# Patient Record
Sex: Male | Born: 1965 | Race: White | Hispanic: No | Marital: Married | Smoking: Never smoker
Health system: Southern US, Community
[De-identification: ages and names within clinical notes are randomized; demographics above are authoritative.]

## PROBLEM LIST (undated history)

## (undated) DIAGNOSIS — F419 Anxiety disorder, unspecified: Secondary | ICD-10-CM

## (undated) DIAGNOSIS — M1990 Unspecified osteoarthritis, unspecified site: Secondary | ICD-10-CM

## (undated) DIAGNOSIS — E079 Disorder of thyroid, unspecified: Secondary | ICD-10-CM

## (undated) DIAGNOSIS — E785 Hyperlipidemia, unspecified: Secondary | ICD-10-CM

## (undated) HISTORY — DX: Hyperlipidemia, unspecified: E78.5

## (undated) HISTORY — PX: TONSILLECTOMY: SHX5217

## (undated) HISTORY — DX: Anxiety disorder, unspecified: F41.9

## (undated) HISTORY — DX: Disorder of thyroid, unspecified: E07.9

## (undated) HISTORY — PX: WISDOM TOOTH EXTRACTION: SHX21

## (undated) HISTORY — PX: HERNIA REPAIR: SHX51

## (undated) HISTORY — DX: Unspecified osteoarthritis, unspecified site: M19.90

---

## 1994-11-03 HISTORY — PX: KNEE SURGERY: SHX244

## 2002-06-26 ENCOUNTER — Emergency Department (HOSPITAL_COMMUNITY): Admission: EM | Admit: 2002-06-26 | Discharge: 2002-06-26 | Payer: Self-pay | Admitting: *Deleted

## 2007-06-16 ENCOUNTER — Ambulatory Visit: Payer: Self-pay | Admitting: Licensed Clinical Social Worker

## 2007-06-23 ENCOUNTER — Ambulatory Visit: Payer: Self-pay | Admitting: Licensed Clinical Social Worker

## 2007-07-27 ENCOUNTER — Ambulatory Visit: Payer: Self-pay | Admitting: Licensed Clinical Social Worker

## 2015-06-28 ENCOUNTER — Other Ambulatory Visit: Payer: Self-pay | Admitting: Neurosurgery

## 2015-06-28 DIAGNOSIS — N281 Cyst of kidney, acquired: Secondary | ICD-10-CM

## 2015-07-03 ENCOUNTER — Other Ambulatory Visit: Payer: Self-pay | Admitting: Neurosurgery

## 2015-07-03 ENCOUNTER — Ambulatory Visit
Admission: RE | Admit: 2015-07-03 | Discharge: 2015-07-03 | Disposition: A | Discharge status: Home / Self Care | Payer: PRIVATE HEALTH INSURANCE | Source: Ambulatory Visit | Attending: Neurosurgery | Admitting: Neurosurgery

## 2015-07-03 DIAGNOSIS — N281 Cyst of kidney, acquired: Secondary | ICD-10-CM

## 2015-08-16 ENCOUNTER — Other Ambulatory Visit (HOSPITAL_COMMUNITY): Payer: Self-pay | Admitting: Urology

## 2015-08-16 DIAGNOSIS — D49519 Neoplasm of unspecified behavior of unspecified kidney: Secondary | ICD-10-CM

## 2015-08-27 ENCOUNTER — Ambulatory Visit (HOSPITAL_COMMUNITY)
Admission: RE | Admit: 2015-08-27 | Discharge: 2015-08-27 | Disposition: A | Discharge status: Home / Self Care | Payer: 59 | Source: Ambulatory Visit | Attending: Urology | Admitting: Urology

## 2015-08-27 DIAGNOSIS — D7389 Other diseases of spleen: Secondary | ICD-10-CM | POA: Insufficient documentation

## 2015-08-27 DIAGNOSIS — D49519 Neoplasm of unspecified behavior of unspecified kidney: Secondary | ICD-10-CM | POA: Diagnosis not present

## 2015-08-27 MED ORDER — GADOBENATE DIMEGLUMINE 529 MG/ML IV SOLN: 20.0000 mL | Freq: Once | INTRAVENOUS | Status: DC | PRN

## 2015-08-27 MED ORDER — GADOBENATE DIMEGLUMINE 529 MG/ML IV SOLN
20.0000 mL | Freq: Once | INTRAVENOUS | Status: AC | PRN
  Administered 2015-08-27: 17 mL via INTRAVENOUS

## 2016-01-23 ENCOUNTER — Other Ambulatory Visit (HOSPITAL_COMMUNITY): Payer: Self-pay | Admitting: Urology

## 2016-01-23 DIAGNOSIS — D4101 Neoplasm of uncertain behavior of right kidney: Secondary | ICD-10-CM

## 2016-03-10 ENCOUNTER — Ambulatory Visit (HOSPITAL_COMMUNITY)
Admission: RE | Admit: 2016-03-10 | Discharge: 2016-03-10 | Disposition: A | Discharge status: Home / Self Care | Payer: BLUE CROSS/BLUE SHIELD | Source: Ambulatory Visit | Attending: Urology | Admitting: Urology

## 2016-03-10 DIAGNOSIS — D734 Cyst of spleen: Secondary | ICD-10-CM | POA: Insufficient documentation

## 2016-03-10 DIAGNOSIS — D4101 Neoplasm of uncertain behavior of right kidney: Secondary | ICD-10-CM | POA: Diagnosis present

## 2016-03-10 MED ORDER — GADOBENATE DIMEGLUMINE 529 MG/ML IV SOLN
20.0000 mL | Freq: Once | INTRAVENOUS | Status: AC | PRN
  Administered 2016-03-10: 17 mL via INTRAVENOUS

## 2016-04-23 ENCOUNTER — Encounter: Payer: Self-pay | Admitting: Internal Medicine

## 2016-07-09 ENCOUNTER — Telehealth: Payer: Self-pay | Admitting: *Deleted

## 2016-07-09 NOTE — Telephone Encounter (Signed)
OK to proceed with colonoscopy.

## 2016-07-09 NOTE — Telephone Encounter (Signed)
Dr. Carlean Purl,  This pt is coming in on 07-18-16 to [facility] for a screening colonoscopy on 08-01-16.  While getting his chart ready, I noted he last saw his PCP on 04-23-16.  He was supposed to get a lap hernia repair with Dr. Rosendo Gros the following week. This information isn't in EPIC; it came via referral information from [facility].  He will be about 3 months post op.  Is he ok to proceed as scheduled?  Thanks, [employer]

## 2016-07-09 NOTE — Telephone Encounter (Signed)
noted 

## 2016-07-18 ENCOUNTER — Ambulatory Visit: Payer: BLUE CROSS/BLUE SHIELD | Admitting: *Deleted

## 2016-07-18 VITALS — Ht 72.0 in | Wt 197.4 lb

## 2016-07-18 DIAGNOSIS — Z1211 Encounter for screening for malignant neoplasm of colon: Secondary | ICD-10-CM

## 2016-07-18 NOTE — Progress Notes (Signed)
Patient denies any allergies to egg or soy products. Patient denies complications with anesthesia/sedation.  Patient denies oxygen use at home and denies diet medications. Emmi instructions for colonoscopy explained but patient refused.  Pamphlet given.Marland Kitchen

## 2016-07-24 ENCOUNTER — Encounter: Payer: Self-pay | Admitting: Internal Medicine

## 2016-07-29 ENCOUNTER — Telehealth: Payer: Self-pay | Admitting: Internal Medicine

## 2016-07-29 NOTE — Telephone Encounter (Signed)
[facility] 348 pm at number that identifies pt by first and last name , Wesley Key

## 2016-07-29 NOTE — Telephone Encounter (Signed)
Scammon on pt's VM at [phone]. Explained we tried him 3 times today with no success and for him to try [place] tomorrow 07-30-16 Wednesday   [facility]

## 2016-07-29 NOTE — Telephone Encounter (Signed)
Called pt and left message we were returning his call regarding his prep questions.

## 2016-08-01 ENCOUNTER — Ambulatory Visit (AMBULATORY_SURGERY_CENTER): Payer: BLUE CROSS/BLUE SHIELD | Admitting: Internal Medicine

## 2016-08-01 ENCOUNTER — Encounter: Payer: Self-pay | Admitting: Internal Medicine

## 2016-08-01 VITALS — BP 124/82 | HR 67 | Temp 96.8°F | Resp 14 | Ht 72.0 in | Wt 197.0 lb

## 2016-08-01 DIAGNOSIS — Z1211 Encounter for screening for malignant neoplasm of colon: Secondary | ICD-10-CM | POA: Diagnosis present

## 2016-08-01 MED ORDER — SODIUM CHLORIDE 0.9 % IV SOLN: 500.0000 mL | INTRAVENOUS | Status: AC

## 2016-08-01 NOTE — Patient Instructions (Addendum)
Your colonoscopy did not show any polyps or cancer. Next routine colonoscopy/screening test in 10 years - 2027  I appreciate the opportunity to care for you. Gatha Mayer, MD, FACG  YOU HAD AN ENDOSCOPIC PROCEDURE TODAY AT [facility]:   Refer to the procedure report that was given to you for any specific questions about what was found during the examination.  If the procedure report does not answer your questions, please call your gastroenterologist to clarify.  If you requested that your care partner not be given the details of your procedure findings, then the procedure report has been included in a sealed envelope for you to review at your convenience later.  YOU SHOULD EXPECT: Some feelings of bloating in the abdomen. Passage of more gas than usual.  Walking can help get rid of the air that was put into your GI tract during the procedure and reduce the bloating. If you had a lower endoscopy (such as a colonoscopy or flexible sigmoidoscopy) you may notice spotting of blood in your stool or on the toilet paper. If you underwent a bowel prep for your procedure, you may not have a normal bowel movement for a few days.  Please Note:  You might notice some irritation and congestion in your nose or some drainage.  This is from the oxygen used during your procedure.  There is no need for concern and it should clear up in a day or so.  SYMPTOMS TO REPORT IMMEDIATELY:   Following lower endoscopy (colonoscopy or flexible sigmoidoscopy):  Excessive amounts of blood in the stool  Significant tenderness or worsening of abdominal pains  Swelling of the abdomen that is new, acute  Fever of 100F or higher   For urgent or emergent issues, a gastroenterologist can be reached at any hour by calling [phone].   DIET:  We do recommend a small meal at first, but then you may proceed to your regular diet.  Drink plenty of fluids but you should avoid alcoholic beverages for  24 hours.  ACTIVITY:  You should plan to take it easy for the rest of today and you should NOT DRIVE or use heavy machinery until tomorrow (because of the sedation medicines used during the test).    FOLLOW UP: Our staff will call the number listed on your records the next business day following your procedure to check on you and address any questions or concerns that you may have regarding the information given to you following your procedure. If we do not reach you, we will leave a message.  However, if you are feeling well and you are not experiencing any problems, there is no need to return our call.  We will assume that you have returned to your regular daily activities without incident.  If any biopsies were taken you will be contacted by phone or by letter within the next 1-3 weeks.  Please call us at [phone] if you have not heard about the biopsies in 3 weeks.    SIGNATURES/CONFIDENTIALITY: You and/or your care partner have signed paperwork which will be entered into your electronic medical record.  These signatures attest to the fact that that the information above on your After Visit Summary has been reviewed and is understood.  Full responsibility of the confidentiality of this discharge information lies with you and/or your care-partner.  Read all of the handouts given to you by your recovery room nurse.   Thank-you for choosing [place] for your  healthcare needs today.

## 2016-08-01 NOTE — Op Note (Signed)
[facility] Patient Name: Wesley Key Procedure Date: 08/01/2016 9:09 AM MRN: [id number] Endoscopist: Gatha Mayer , MD Age: 50 Referring MD:  Date of Birth: Mar 29, 1966 Gender: Male Account #: [id number] Procedure:                Colonoscopy Indications:              Screening for colorectal malignant neoplasm Medicines:                Propofol per Anesthesia, Monitored Anesthesia Care Procedure:                Pre-Anesthesia Assessment:                           - Prior to the procedure, a History and Physical                            was performed, and patient medications and                            allergies were reviewed. The patient's tolerance of                            previous anesthesia was also reviewed. The risks                            and benefits of the procedure and the sedation                            options and risks were discussed with the patient.                            All questions were answered, and informed consent                            was obtained. Prior Anticoagulants: The patient has                            taken no previous anticoagulant or antiplatelet                            agents. ASA Grade Assessment: II - A patient with                            mild systemic disease. After reviewing the risks                            and benefits, the patient was deemed in                            satisfactory condition to undergo the procedure.                           After obtaining informed consent, the colonoscope  was passed under direct vision. Throughout the                            procedure, the patient's blood pressure, pulse, and                            oxygen saturations were monitored continuously. The                            Model PCF-H190L [id number]) scope was introduced                            through the anus and advanced to the the cecum,      identified by appendiceal orifice and ileocecal                            valve. The quality of the bowel preparation was                            good. The colonoscopy was performed without                            difficulty. The patient tolerated the procedure                            well. The bowel preparation used was Miralax. The                            ileocecal valve, appendiceal orifice, and rectum                            were photographed. Scope In: 9:19:55 AM Scope Out: 9:36:50 AM Scope Withdrawal Time: 0 hours 14 minutes 38 seconds  Total Procedure Duration: 0 hours 16 minutes 55 seconds  Findings:                 The perianal and digital rectal examinations were                            normal. Pertinent negatives include normal prostate                            (size, shape, and consistency).                           A few diverticula were found in the sigmoid colon.                           The exam was otherwise without abnormality on                            direct and retroflexion views. Complications:            No immediate complications. Estimated blood loss:  None. Estimated Blood Loss:     Estimated blood loss: none. Impression:               - Mild diverticulosis in the sigmoid colon.                           - The examination was otherwise normal on direct                            and retroflexion views.                           - No specimens collected. Recommendation:           - Repeat colonoscopy in 10 years for screening                            purposes.                           - Patient has a contact number available for                            emergencies. The signs and symptoms of potential                            delayed complications were discussed with the                            patient. Return to normal activities tomorrow.                            Written discharge instructions  were provided to the                            patient.                           - Resume previous diet.                           - Continue present medications.                           - Patient has a contact number available for                            emergencies. The signs and symptoms of potential                            delayed complications were discussed with the                            patient. Return to normal activities tomorrow.                            Written discharge instructions were provided to the  patient. Gatha Mayer, MD 08/01/2016 9:44:53 AM This report has been signed electronically.

## 2016-08-01 NOTE — Progress Notes (Signed)
A/ox3 pleased with MAC, report to Suzanne RN 

## 2016-08-04 ENCOUNTER — Telehealth: Payer: Self-pay

## 2016-08-04 NOTE — Telephone Encounter (Signed)
  Follow up Call-  Call back number 08/01/2016  Post procedure Call Back phone  # [phone]  Permission to leave phone message Yes  Some recent data might be hidden     Patient questions:  Do you have a fever, pain , or abdominal swelling? No. Pain Score  0 *  Have you tolerated food without any problems? Yes.    Have you been able to return to your normal activities? Yes.    Do you have any questions about your discharge instructions: Diet   No. Medications  No. Follow up visit  No.  Do you have questions or concerns about your Care? No.  Actions: * If pain score is 4 or above: No action needed, pain <4.  No problems per the pt. maw

## 2018-11-05 DIAGNOSIS — L570 Actinic keratosis: Secondary | ICD-10-CM | POA: Diagnosis not present

## 2018-11-05 DIAGNOSIS — D225 Melanocytic nevi of trunk: Secondary | ICD-10-CM | POA: Diagnosis not present

## 2018-11-05 DIAGNOSIS — D2362 Other benign neoplasm of skin of left upper limb, including shoulder: Secondary | ICD-10-CM | POA: Diagnosis not present

## 2019-02-07 DIAGNOSIS — Z0000 Encounter for general adult medical examination without abnormal findings: Secondary | ICD-10-CM | POA: Diagnosis not present

## 2019-02-15 DIAGNOSIS — R82998 Other abnormal findings in urine: Secondary | ICD-10-CM | POA: Diagnosis not present

## 2019-02-18 DIAGNOSIS — Z0000 Encounter for general adult medical examination without abnormal findings: Secondary | ICD-10-CM | POA: Diagnosis not present

## 2019-05-12 ENCOUNTER — Other Ambulatory Visit: Payer: Self-pay | Admitting: Internal Medicine

## 2019-05-12 DIAGNOSIS — E785 Hyperlipidemia, unspecified: Secondary | ICD-10-CM

## 2019-06-22 ENCOUNTER — Other Ambulatory Visit: Payer: BLUE CROSS/BLUE SHIELD

## 2020-03-19 ENCOUNTER — Ambulatory Visit
Admission: RE | Admit: 2020-03-19 | Discharge: 2020-03-19 | Disposition: A | Discharge status: Home / Self Care | Payer: Self-pay | Source: Ambulatory Visit | Attending: Internal Medicine | Admitting: Internal Medicine

## 2020-03-19 DIAGNOSIS — E785 Hyperlipidemia, unspecified: Secondary | ICD-10-CM

## 2021-01-02 DIAGNOSIS — L989 Disorder of the skin and subcutaneous tissue, unspecified: Secondary | ICD-10-CM | POA: Diagnosis not present

## 2021-01-02 DIAGNOSIS — L821 Other seborrheic keratosis: Secondary | ICD-10-CM | POA: Diagnosis not present

## 2021-01-02 DIAGNOSIS — L723 Sebaceous cyst: Secondary | ICD-10-CM | POA: Diagnosis not present

## 2021-01-02 DIAGNOSIS — D225 Melanocytic nevi of trunk: Secondary | ICD-10-CM | POA: Diagnosis not present

## 2021-01-02 DIAGNOSIS — L578 Other skin changes due to chronic exposure to nonionizing radiation: Secondary | ICD-10-CM | POA: Diagnosis not present

## 2021-01-02 DIAGNOSIS — D485 Neoplasm of uncertain behavior of skin: Secondary | ICD-10-CM | POA: Diagnosis not present

## 2021-02-19 DIAGNOSIS — Z125 Encounter for screening for malignant neoplasm of prostate: Secondary | ICD-10-CM | POA: Diagnosis not present

## 2021-02-19 DIAGNOSIS — E039 Hypothyroidism, unspecified: Secondary | ICD-10-CM | POA: Diagnosis not present

## 2021-02-19 DIAGNOSIS — E785 Hyperlipidemia, unspecified: Secondary | ICD-10-CM | POA: Diagnosis not present

## 2021-02-26 DIAGNOSIS — Z0000 Encounter for general adult medical examination without abnormal findings: Secondary | ICD-10-CM | POA: Diagnosis not present

## 2021-02-26 DIAGNOSIS — Z1331 Encounter for screening for depression: Secondary | ICD-10-CM | POA: Diagnosis not present

## 2021-02-26 DIAGNOSIS — R82998 Other abnormal findings in urine: Secondary | ICD-10-CM | POA: Diagnosis not present

## 2021-02-26 DIAGNOSIS — Z23 Encounter for immunization: Secondary | ICD-10-CM | POA: Diagnosis not present

## 2021-02-26 DIAGNOSIS — I2584 Coronary atherosclerosis due to calcified coronary lesion: Secondary | ICD-10-CM | POA: Diagnosis not present

## 2021-03-18 DIAGNOSIS — D485 Neoplasm of uncertain behavior of skin: Secondary | ICD-10-CM | POA: Diagnosis not present

## 2021-03-18 DIAGNOSIS — D487 Neoplasm of uncertain behavior of other specified sites: Secondary | ICD-10-CM | POA: Diagnosis not present

## 2021-08-29 DIAGNOSIS — H5213 Myopia, bilateral: Secondary | ICD-10-CM | POA: Diagnosis not present

## 2021-08-29 DIAGNOSIS — D3131 Benign neoplasm of right choroid: Secondary | ICD-10-CM | POA: Diagnosis not present

## 2022-01-15 DIAGNOSIS — D225 Melanocytic nevi of trunk: Secondary | ICD-10-CM | POA: Diagnosis not present

## 2022-01-15 DIAGNOSIS — D2362 Other benign neoplasm of skin of left upper limb, including shoulder: Secondary | ICD-10-CM | POA: Diagnosis not present

## 2022-01-15 DIAGNOSIS — L570 Actinic keratosis: Secondary | ICD-10-CM | POA: Diagnosis not present

## 2022-01-15 DIAGNOSIS — L578 Other skin changes due to chronic exposure to nonionizing radiation: Secondary | ICD-10-CM | POA: Diagnosis not present

## 2022-01-15 DIAGNOSIS — L821 Other seborrheic keratosis: Secondary | ICD-10-CM | POA: Diagnosis not present

## 2022-03-03 DIAGNOSIS — Z125 Encounter for screening for malignant neoplasm of prostate: Secondary | ICD-10-CM | POA: Diagnosis not present

## 2022-03-03 DIAGNOSIS — Z0000 Encounter for general adult medical examination without abnormal findings: Secondary | ICD-10-CM | POA: Diagnosis not present

## 2022-03-03 DIAGNOSIS — E039 Hypothyroidism, unspecified: Secondary | ICD-10-CM | POA: Diagnosis not present

## 2022-03-03 DIAGNOSIS — E785 Hyperlipidemia, unspecified: Secondary | ICD-10-CM | POA: Diagnosis not present

## 2022-03-10 DIAGNOSIS — Z0000 Encounter for general adult medical examination without abnormal findings: Secondary | ICD-10-CM | POA: Diagnosis not present

## 2022-03-10 DIAGNOSIS — R82998 Other abnormal findings in urine: Secondary | ICD-10-CM | POA: Diagnosis not present

## 2022-03-10 DIAGNOSIS — E039 Hypothyroidism, unspecified: Secondary | ICD-10-CM | POA: Diagnosis not present

## 2022-03-10 DIAGNOSIS — Z1331 Encounter for screening for depression: Secondary | ICD-10-CM | POA: Diagnosis not present

## 2022-04-25 IMAGING — CT CT CARDIAC CORONARY ARTERY CALCIUM SCORE
3 series · 14 of 20 positions shown, 16 images · non-contrast
Comparison: None

CLINICAL DATA: History of hyperlipidemia in a 53-year-old white
male.

EXAM:
CT CARDIAC CORONARY ARTERY CALCIUM SCORE
TECHNIQUE: Non-contrast imaging through the heart was performed using
prospective ECG gating. Image post processing was performed on an
independent workstation, allowing for quantitative analysis of the
heart and coronary arteries. Note that this exam targets the heart
and the chest was not imaged in its entirety.

[Series 2: calcium scoring 2.00 qr36 bestdiast 70% hrt calciu · axial · 0.41mm/px · z∈[+1847,+1943]mm · 4 of 80 slices shown]
[im 16/80  vessel]
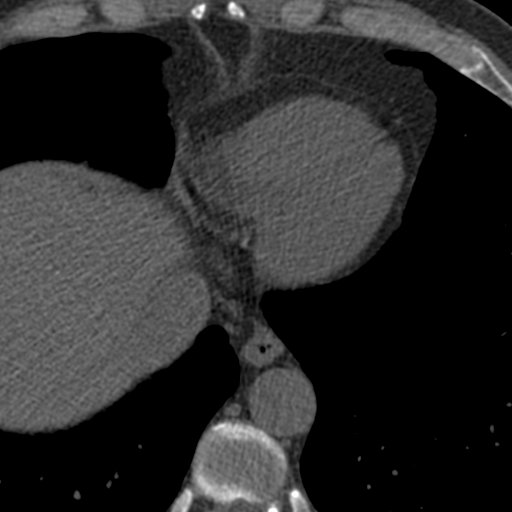
[im 32/80  vessel]
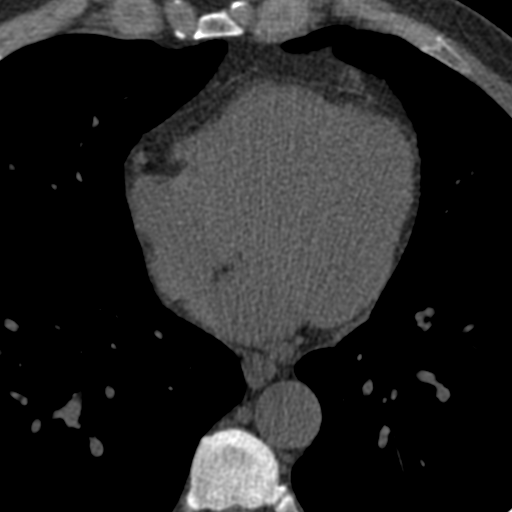
[im 48/80  vessel]
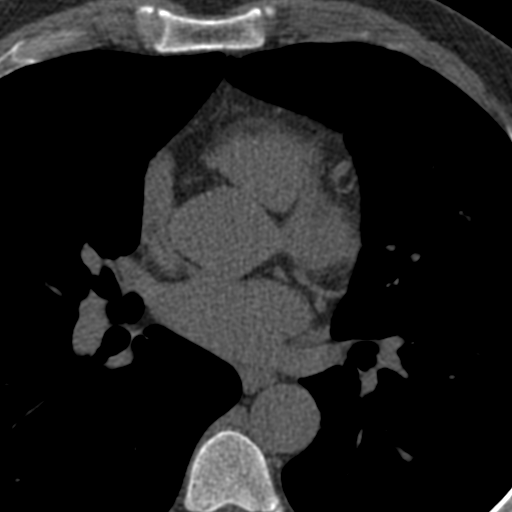
[im 64/80  vessel]
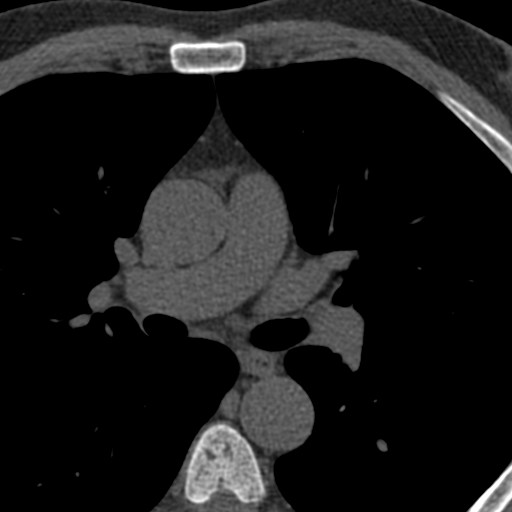

[Series 3: calcium scoring 2.00 br40 bestdiast 70% axial · axial · 0.61mm/px · z∈[+1843,+1947]mm · 5 of 80 slices shown, 7 images]
[im 14/80  vessel]
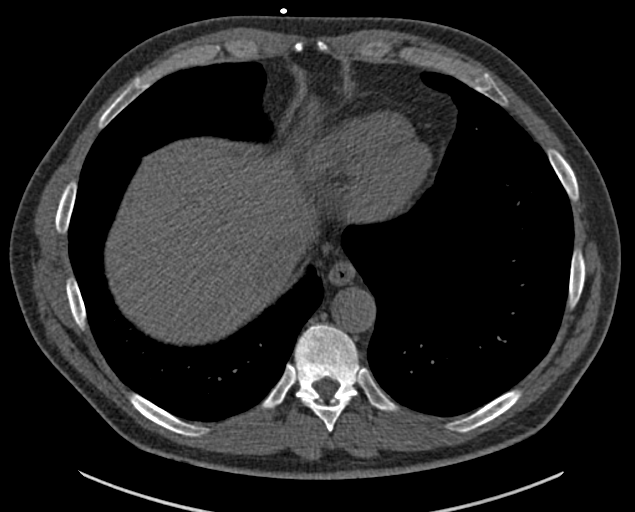
[im 14/80  lung]
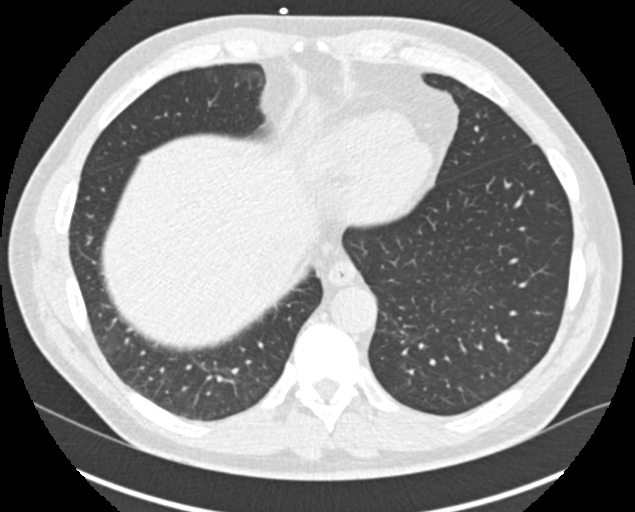
[im 27/80  vessel]
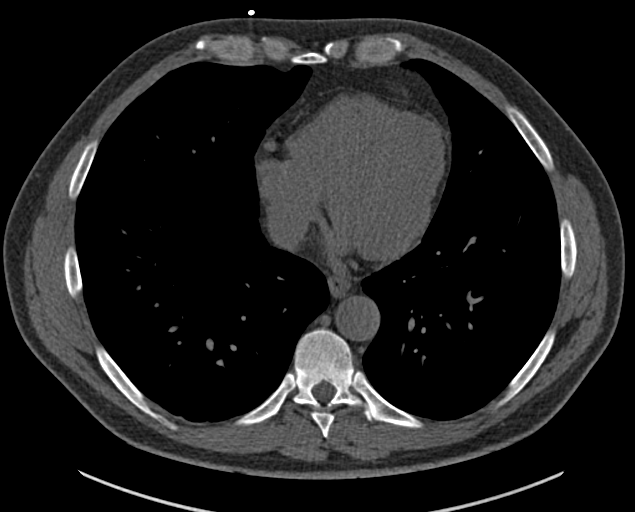
[im 40/80  vessel]
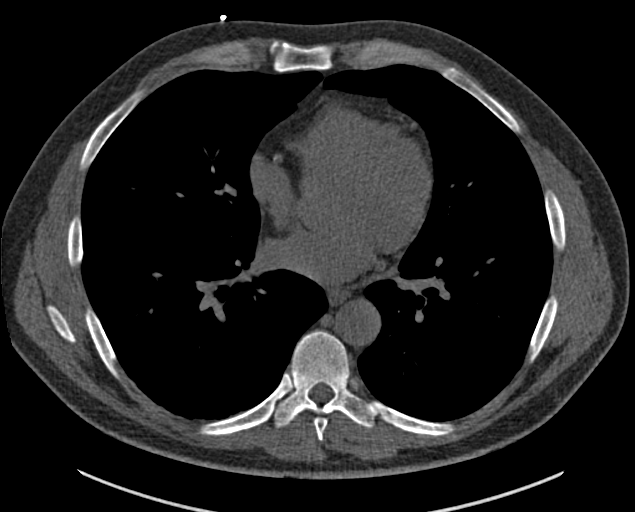
[im 53/80  vessel]
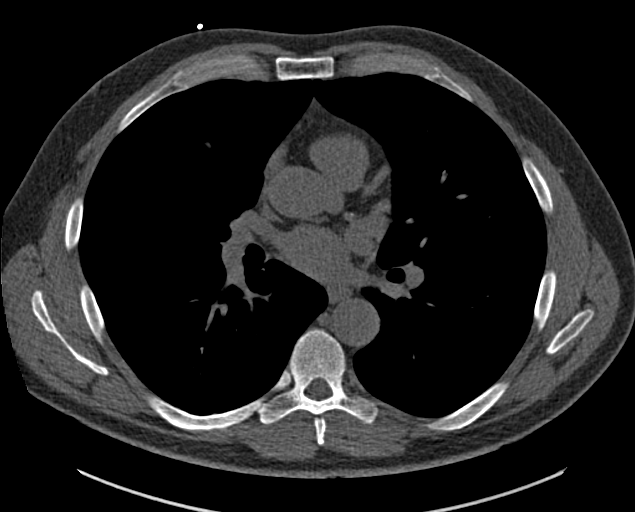
[im 66/80  vessel]
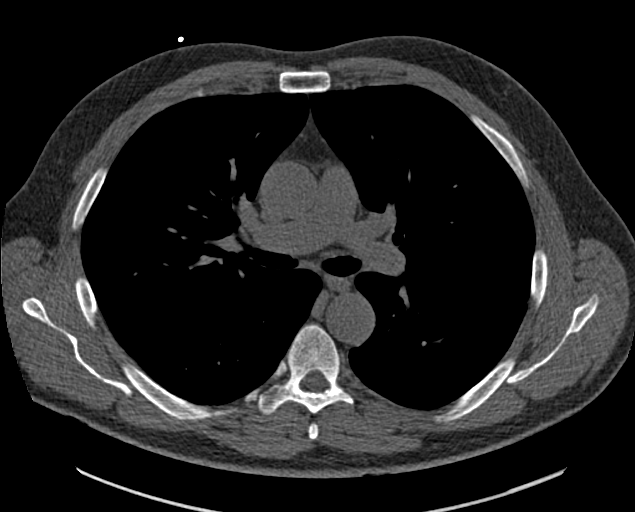
[im 66/80  lung]
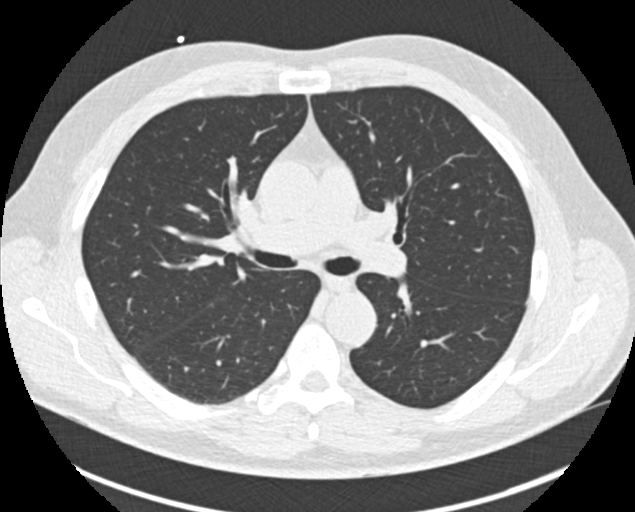

[Series 9: calcium scoring 2.00 br60 bestdiast 70% lungs · axial · 0.61mm/px · z∈[+1843,+1947]mm · 5 of 80 slices shown]
[im 14/80  vessel]
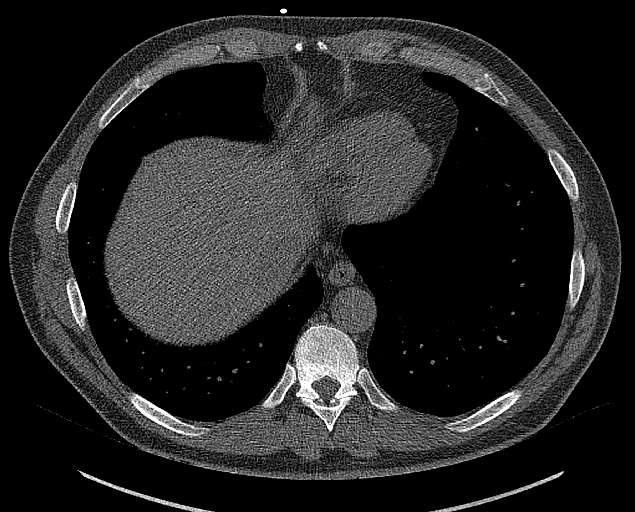
[im 27/80  vessel]
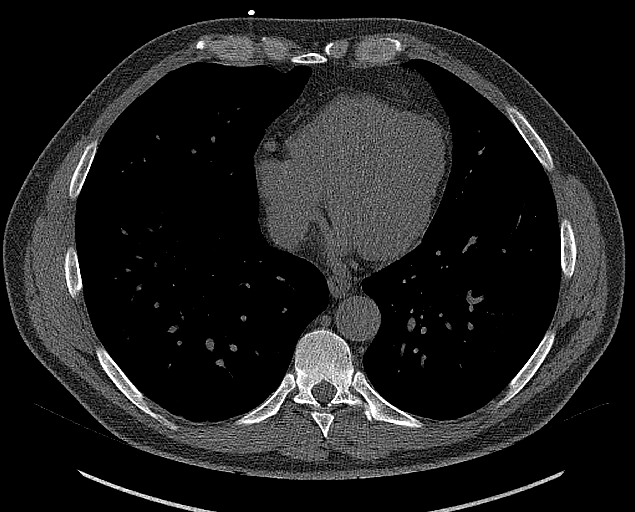
[im 40/80  vessel]
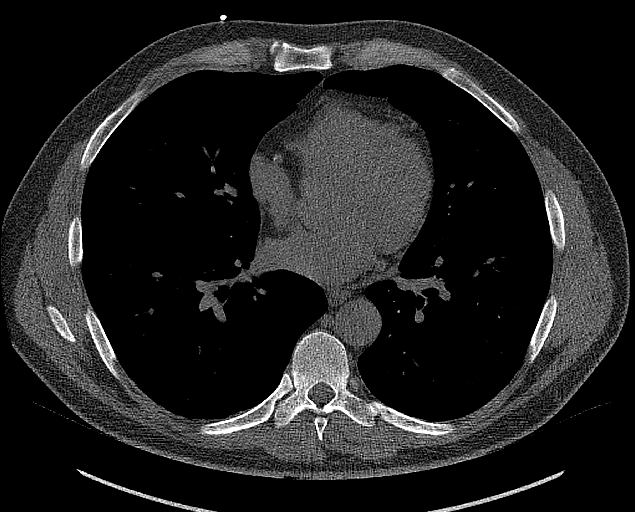
[im 53/80  vessel]
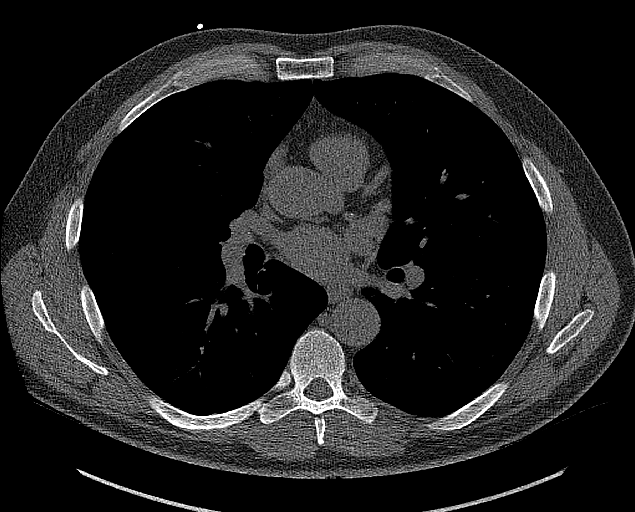
[im 66/80  vessel]
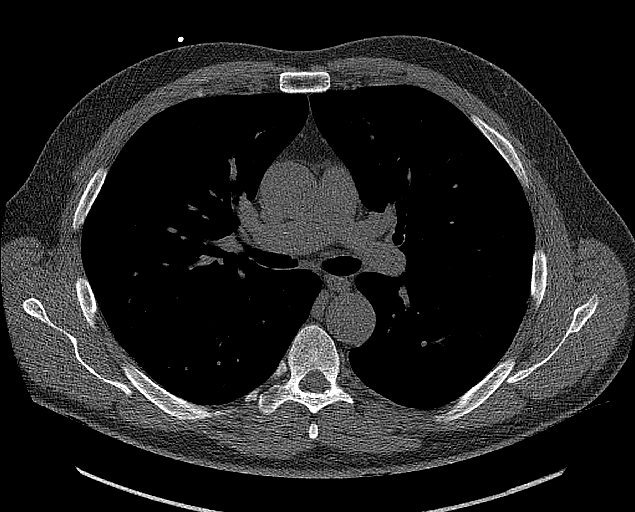

[14 of 20 positions shown; findings below may reference images not displayed]

FINDINGS: CORONARY CALCIUM SCORES:

Left Main: 0

LAD: 0

LCx:

RCA:

Total Agatston Score:

[HOSPITAL] percentile: 64

AORTA MEASUREMENTS:

Ascending Aorta: 30 for mm

Descending Aorta: 29 mm

OTHER FINDINGS:

Heart size is normal without pericardial effusion. No aortic
calcification. Central pulmonary vasculature is of normal caliber.

Limited visualization of the mediastinum is unremarkable.

Basilar atelectasis. No consolidation or pleural effusion. Airways
are patent.

No sign of chest wall mass.

Visualized skeletal structures are unremarkable.
IMPRESSION: Coronary artery calcium score is 16.7. This places the patient in
the 64th percentile for the patient's age, gender, and
race/ethnicity who are free of clinical cardiovascular disease and
treated diabetes.

## 2022-09-08 DIAGNOSIS — D3131 Benign neoplasm of right choroid: Secondary | ICD-10-CM | POA: Diagnosis not present

## 2022-09-08 DIAGNOSIS — H5213 Myopia, bilateral: Secondary | ICD-10-CM | POA: Diagnosis not present

## 2022-12-16 ENCOUNTER — Encounter
Payer: BC Managed Care – PPO | Attending: Physical Medicine and Rehabilitation | Admitting: Physical Medicine and Rehabilitation

## 2022-12-16 VITALS — BP 124/92 | HR 88 | Ht 72.0 in | Wt 220.0 lb

## 2022-12-16 DIAGNOSIS — F419 Anxiety disorder, unspecified: Secondary | ICD-10-CM | POA: Insufficient documentation

## 2022-12-16 DIAGNOSIS — G894 Chronic pain syndrome: Secondary | ICD-10-CM | POA: Diagnosis not present

## 2022-12-16 NOTE — Progress Notes (Signed)
Subjective:    Patient ID: Wesley Key, male    DOB: 22-May-1966, 57 y.o.   MRN: [id number]  HPI Mrs. Cdebaca is a 57 year old man who presents to establish care for chronic pain.  1) Chronic pain -average pain is 6/10 -pain right now is 6/10 -he takes advil.  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 General activity 5 Relation with others 5 Enjoyment of life 5 What TIME of day is your pain at its worst? varies Sleep (in general) Good  Pain is worse with: standing and some activites Pain improves with:  n/a Relief from Meds: 5  walk without assistance  employed # of hrs/week 50  anxiety     Family History  Problem Relation Age of Onset   Colon cancer Neg Hx    Colon polyps Neg Hx    Esophageal cancer Neg Hx    Rectal cancer Neg Hx    Stomach cancer Neg Hx    Social History   Socioeconomic History   Marital status: Married    Spouse name: Not on file   Number of children: Not on file   Years of education: Not on file   Highest education level: Not on file  Occupational History   Not on file  Tobacco Use   Smoking status: Never   Smokeless tobacco: Never  Substance and Sexual Activity   Alcohol use: Yes    Alcohol/week: 2.0 standard drinks of alcohol    Types: 2 Cans of beer per week   Drug use: No   Sexual activity: Not on file  Other Topics Concern   Not on file  Social History Narrative   Not on file   Social Determinants of Health   Financial Resource Strain: Not on file  Food Insecurity: Not on file  Transportation Needs: Not on file  Physical Activity: Not on file  Stress: Not on file  Social  Connections: Not on file   Past Surgical History:  Procedure Laterality Date   HERNIA REPAIR  2002, 2017   x 2   KNEE SURGERY Right 1996   TONSILLECTOMY     WISDOM TOOTH EXTRACTION     Past Medical History:  Diagnosis Date   Anxiety    Arthritis    right knee, lower back L3-4-5, Left shoulder   Hyperlipidemia    Thyroid disease    There were no vitals taken for this visit.  Opioid Risk Score:   Fall Risk Score:  `1  Depression screen PHQ 2/9      No data to display           Review of Systems  Gastrointestinal:  Positive for diarrhea.  Musculoskeletal:  Positive for arthralgias and back pain.       Left arm RT knee RT ankle B/L hand Upper Back Lumbar  All other systems reviewed and are negative.      Objective:   Physical Exam  Gen: no distress, normal appearing, BMI 29.84, weight 220 lbs, BP 124/92 HEENT: oral mucosa pink and moist, NCAT Cardio: Reg rate Chest: normal effort, normal rate of breathing  Abd: soft, non-distended Ext: no edema Psych: pleasant, normal affect Skin: intact Neuro: Alert and oriented x3      Assessment & Plan:   1) Chronic Pain Syndrome secondary to lower back pain, left shoulder pain, knee pain -discussed side effects with chronic advil use -discussed microdosing ketamine to help with both pain and anxiety -discussed red light therapy  -Provided with a pain relief journal and discussed that it contains foods and lifestyle tips to naturally help to improve pain. Discussed that these lifestyle strategies are also very good for health unlike some medications which can have negative side effects. Discussed that the act of keeping a journal can be therapeutic and helpful to realize patterns what helps to trigger and alleviate pain.    -Discussed current symptoms of pain and history of pain.  -Discussed benefits of exercise in reducing pain. -Discussed following foods that may reduce pain: 1) Ginger (especially studied for  arthritis)- reduce leukotriene production to decrease inflammation 2) Blueberries- high in phytonutrients that decrease inflammation 3) Salmon- marine omega-3s reduce joint swelling and pain 4) Pumpkin seeds- reduce inflammation 5) dark chocolate- reduces inflammation 6) turmeric- reduces inflammation 7) tart cherries - reduce pain and stiffness 8) extra virgin olive oil - its compound olecanthal helps to block prostaglandins  9) chili peppers- can be eaten or applied topically via capsaicin 10) mint- helpful for headache, muscle aches, joint pain, and itching 11) garlic- reduces inflammation  Link to further information on diet for chronic pain: [email or web address]   2) Anxiety: -Discussed his goal to get of klonopin.  -discussed behavioral therapy, referred.  -discussed that he grew up in a bad place.  -discussed similar symptoms in his children -discussed that he sleeps well at night.  -Discussed exercise and meditation as tools to decrease anxiety. -Recommended Down Dog Yoga app -Discussed spending time outdoors. -Discussed positive re-framing of anxiety.  -Discussed the following foods that have been show to reduce anxiety: 1) [place] nuts, mushrooms, soy beans due to their high selenium content. Upper limit of toxicity of selenium is 474mg/day so no more than 3-4 bBolivianuts per day.  2) Fatty fish such as salmon, mackerel, sardines, trout, and herring- high in omega-3 fatty acids 3) Eggs- increases serotonin and dopamine 4) Pumpkin seeds- high in omega-3 fatty acids 5) dark chocolate- high in flavanols that increase blood flow to brain 6) turmeric- take with black pepper to increase absorption 7) chamomile tea- antioxidant and anti-inflammatory properties 8) yogurt without sugar- supports gut-brain axis 9) green tea- contains L- theanine 10) blueberries- high in vitamin C and antioxidants 11) [place]  high in tryptophan which gets converted to serotonin 12) bell peppers- rich in vitamin C and antioxidants 13) citrus fruits- rich in vitamin C and antioxidants 14) almonds- high in vitamin E and healthy fats 15) chia seeds- high in omega-3 fatty acids

## 2022-12-16 NOTE — Patient Instructions (Signed)
Foods that help with pain: 1) Ginger (especially studied for arthritis)- reduce leukotriene production to decrease inflammation 2) Blueberries- high in phytonutrients that decrease inflammation 3) Salmon- marine omega-3s reduce joint swelling and pain 4) Pumpkin seeds- reduce inflammation 5) dark chocolate- reduces inflammation 6) turmeric- reduces inflammation 7) tart cherries - reduce pain and stiffness 8) extra virgin olive oil - its compound olecanthal helps to block prostaglandins  9) chili peppers- can be eaten or applied topically via capsaicin 10) mint- helpful for headache, muscle aches, joint pain, and itching 11) garlic- reduces inflammation  Link to further information on diet for chronic pain: [email or web address]    Anxiety: -Discussed exercise and meditation as tools to decrease anxiety. -Recommended Down Dog Yoga app -Discussed spending time outdoors. -Discussed positive re-framing of anxiety.  -Discussed the following foods that have been show to reduce anxiety: 1) [place] nuts, mushrooms, soy beans due to their high selenium content. Upper limit of toxicity of selenium is 455mg/day so no more than 3-4 bBolivianuts per day.  2) Fatty fish such as salmon, mackerel, sardines, trout, and herring- high in omega-3 fatty acids 3) Eggs- increases serotonin and dopamine 4) Pumpkin seeds- high in omega-3 fatty acids 5) dark chocolate- high in flavanols that increase blood flow to brain 6) turmeric- take with black pepper to increase absorption 7) chamomile tea- antioxidant and anti-inflammatory properties 8) yogurt without sugar- supports gut-brain axis 9) green tea- contains L- theanine 10) blueberries- high in vitamin C and antioxidants 11) [place] high in tryptophan which gets converted to serotonin 12) bell peppers- rich in vitamin C and antioxidants 13) citrus fruits- rich in vitamin C and  antioxidants 14) almonds- high in vitamin E and healthy fats 15) chia seeds- high in omega-3 fatty acids

## 2022-12-17 ENCOUNTER — Telehealth: Payer: Self-pay | Admitting: *Deleted

## 2022-12-17 NOTE — Telephone Encounter (Signed)
[facility] pharmacy call to confirm rx sent for Ketamin compound on patient. The patient told them that his weight was 250lb but I confirmed patient's weight was 220. Do you want to keep rx as prescibed?

## 2022-12-18 NOTE — Telephone Encounter (Signed)
Verified wt of 220 with pharmacist and that rx should be adjusted.0.3

## 2023-01-19 ENCOUNTER — Encounter
Payer: BC Managed Care – PPO | Attending: Physical Medicine and Rehabilitation | Admitting: Physical Medicine and Rehabilitation

## 2023-01-19 VITALS — BP 129/89 | HR 80 | Ht 72.0 in | Wt 207.0 lb

## 2023-01-19 DIAGNOSIS — Z789 Other specified health status: Secondary | ICD-10-CM | POA: Diagnosis not present

## 2023-01-19 DIAGNOSIS — F419 Anxiety disorder, unspecified: Secondary | ICD-10-CM | POA: Diagnosis not present

## 2023-01-19 DIAGNOSIS — E663 Overweight: Secondary | ICD-10-CM | POA: Insufficient documentation

## 2023-01-19 DIAGNOSIS — G894 Chronic pain syndrome: Secondary | ICD-10-CM | POA: Insufficient documentation

## 2023-01-19 NOTE — Addendum Note (Signed)
Addended by: Izora Ribas on: 01/19/2023 03:41 PM   Modules accepted: Level of Service

## 2023-01-19 NOTE — Progress Notes (Addendum)
Subjective:    Patient ID: Wesley Key, male    DOB: 02-28-1966, 57 y.o.   MRN: [id number]  HPI [name] is a 57 year old man who presents for follow-up of chronic pain and anxiety  1) Chronic pain -average pain is 6/10 -pain right now is 6/10 -he takes advil.  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   2) Chronic anxiety: -he has been taking klonopin since 2005 -he has never changed the dosage on this medication -feels brighter -feels very happy -at first he felt agitated when he started the ketamine -he decreased klonopin to 1/2 a tablet in the morning and a full tablet in the evening.   3) Overweight -he has been eating cleaner and exercising 4 times per week -he used to weight train -he lifts aerobically and not high weight as he does not want to get injuries. -wants to get down to 185 lbs  -he dropped three pant sizes -discussed that he has been able to loose weight to 185 lbs before.   Pain Inventory Average Pain 3 Pain Right Now 3 My pain is constant & intermittent   In the last 24 hours, has pain interfered with the following? General activity 0 Relation with others 0 Enjoyment of life 0 What TIME of day is your pain at its worst? morning  Sleep (in general) Good  Pain is worse with: sitting and standing Pain improves with: heat/ice and therapy/exercise Relief from Meds: 4       Family History  Problem Relation Age of Onset   Colon cancer Neg Hx    Colon polyps Neg Hx    Esophageal cancer Neg Hx    Rectal cancer Neg Hx    Stomach cancer Neg Hx    Social History   Socioeconomic History   Marital status: Married    Spouse name: Not on file   Number of children: Not on file   Years of education: Not on file   Highest education level: Not on file   Occupational History   Not on file  Tobacco Use   Smoking status: Never   Smokeless tobacco: Never  Substance and Sexual Activity   Alcohol use: Yes    Alcohol/week: 2.0 standard drinks of alcohol    Types: 2 Cans of beer per week   Drug use: No   Sexual activity: Not on file  Other Topics Concern   Not on file  Social History Narrative   Not on file   Social Determinants of Health   Financial Resource Strain: Not on file  Food Insecurity: Not on file  Transportation Needs: Not on file  Physical Activity: Not on file  Stress: Not on file  Social Connections: Not on file   Past Surgical History:  Procedure Laterality Date   HERNIA REPAIR  2002, 2017   x 2   KNEE SURGERY Right 1996   TONSILLECTOMY     WISDOM TOOTH EXTRACTION     Past Medical History:  Diagnosis Date   Anxiety    Arthritis    right knee, lower back L3-4-5, Left shoulder   Hyperlipidemia    Thyroid disease    There were no vitals taken for this visit.  Opioid Risk Score:   Fall Risk Score:  `  1  Depression screen [facility] 2/9     12/16/2022    2:17 PM  Depression screen PHQ 2/9  Decreased Interest 0  Down, Depressed, Hopeless 1  PHQ - 2 Score 1  Altered sleeping 1  Tired, decreased energy 0  Change in appetite 3  Feeling bad or failure about yourself  0  Trouble concentrating 1  Moving slowly or fidgety/restless 0  Suicidal thoughts 0  PHQ-9 Score 6  Difficult doing work/chores Somewhat difficult     Review of Systems  Gastrointestinal:  Positive for diarrhea.  Musculoskeletal:  Positive for arthralgias and back pain.       Left arm RT knee RT ankle B/L hand Upper Back Lumbar  All other systems reviewed and are negative.      Objective:   Physical Exam  Gen: no distress, normal appearing, BMI 28.07, weight 207 lbs, BP 129/89 Gen: no distress, normal appearing HEENT: oral mucosa pink and moist, NCAT Cardio: Reg rate Chest: normal effort, normal rate of breathing Abd: soft,  non-distended Ext: no edema Psych: pleasant, normal affect Skin: intact Neuro: Alert and oriented x3      Assessment & Plan:   1) Chronic Pain Syndrome secondary to lower back pain, left shoulder pain, knee pain -discussed side effects with chronic advil use -discussed microdosing ketamine to help with both pain and anxiety -discussed red light therapy -commended his positive dietary changes and exercise routine -commended on losing 13 lbs!  -Provided with a pain relief journal and discussed that it contains foods and lifestyle tips to naturally help to improve pain. Discussed that these lifestyle strategies are also very good for health unlike some medications which can have negative side effects. Discussed that the act of keeping a journal can be therapeutic and helpful to realize patterns what helps to trigger and alleviate pain.    -Discussed current symptoms of pain and history of pain.  -Discussed benefits of exercise in reducing pain. -Discussed following foods that may reduce pain: 1) Ginger (especially studied for arthritis)- reduce leukotriene production to decrease inflammation 2) Blueberries- high in phytonutrients that decrease inflammation 3) Salmon- marine omega-3s reduce joint swelling and pain 4) Pumpkin seeds- reduce inflammation 5) dark chocolate- reduces inflammation 6) turmeric- reduces inflammation 7) tart cherries - reduce pain and stiffness 8) extra virgin olive oil - its compound olecanthal helps to block prostaglandins  9) chili peppers- can be eaten or applied topically via capsaicin 10) mint- helpful for headache, muscle aches, joint pain, and itching 11) garlic- reduces inflammation  Link to further information on diet for chronic pain: [email or web address]   2) Anxiety: -Discussed his goal to get of klonopin.  -discussed that he enjoys work, that he has mended relationship with  his father and brother.  -continue ketamine -discussed the benefit of habit -discussed that he loves golf -discussed neuropsych referral and that he no longer feels he needs it -decrease clonazepam to 1/2 a tablet in the morning and 1 at night -commended on avoiding carbs, sugar dairy -discussed behavioral therapy, referred.  -discussed that he grew up in a bad place.  -discussed similar symptoms in his children -discussed that he sleeps well at night.  -Discussed exercise and meditation as tools to decrease anxiety. -Recommended Down Dog Yoga app -Discussed spending time outdoors. -Discussed positive re-framing of anxiety.  -Discussed the following foods that have been show to reduce anxiety: 1) [place] nuts, mushrooms, soy beans due to their high selenium content. Upper limit of toxicity of selenium is  454mcg/day so no more than 3-4 [place] nuts per day.  2) Fatty fish such as salmon, mackerel, sardines, trout, and herring- high in omega-3 fatty acids 3) Eggs- increases serotonin and dopamine 4) Pumpkin seeds- high in omega-3 fatty acids 5) dark chocolate- high in flavanols that increase blood flow to brain 6) turmeric- take with black pepper to increase absorption 7) chamomile tea- antioxidant and anti-inflammatory properties 8) yogurt without sugar- supports gut-brain axis 9) green tea- contains L- theanine 10) blueberries- high in vitamin C and antioxidants 11) [place]- high in tryptophan which gets converted to serotonin 12) bell peppers- rich in vitamin C and antioxidants 13) citrus fruits- rich in vitamin C and antioxidants 14) almonds- high in vitamin E and healthy fats 15) chia seeds- high in omega-3 fatty acids  3) Overweight -discussed his clean eating, his exercise routine.  -discussed fasting  -discussed continuous blood glucose monitoring   4) Abstinence of alcohol -commended on his ability to stop drinking alcohol!  41 minutes spent in discussion of his pain,  anxiety, less anxiety, overweight, wean of clonazepam, response to ketamine

## 2023-01-21 DIAGNOSIS — D2362 Other benign neoplasm of skin of left upper limb, including shoulder: Secondary | ICD-10-CM | POA: Diagnosis not present

## 2023-01-21 DIAGNOSIS — D225 Melanocytic nevi of trunk: Secondary | ICD-10-CM | POA: Diagnosis not present

## 2023-01-21 DIAGNOSIS — L570 Actinic keratosis: Secondary | ICD-10-CM | POA: Diagnosis not present

## 2023-01-21 DIAGNOSIS — L578 Other skin changes due to chronic exposure to nonionizing radiation: Secondary | ICD-10-CM | POA: Diagnosis not present

## 2023-03-17 DIAGNOSIS — R7989 Other specified abnormal findings of blood chemistry: Secondary | ICD-10-CM | POA: Diagnosis not present

## 2023-03-17 DIAGNOSIS — E039 Hypothyroidism, unspecified: Secondary | ICD-10-CM | POA: Diagnosis not present

## 2023-03-17 DIAGNOSIS — R5383 Other fatigue: Secondary | ICD-10-CM | POA: Diagnosis not present

## 2023-03-17 DIAGNOSIS — E785 Hyperlipidemia, unspecified: Secondary | ICD-10-CM | POA: Diagnosis not present

## 2023-03-17 DIAGNOSIS — Z125 Encounter for screening for malignant neoplasm of prostate: Secondary | ICD-10-CM | POA: Diagnosis not present

## 2023-03-23 DIAGNOSIS — E039 Hypothyroidism, unspecified: Secondary | ICD-10-CM | POA: Diagnosis not present

## 2023-03-23 DIAGNOSIS — E785 Hyperlipidemia, unspecified: Secondary | ICD-10-CM | POA: Diagnosis not present

## 2023-03-23 DIAGNOSIS — Z1212 Encounter for screening for malignant neoplasm of rectum: Secondary | ICD-10-CM | POA: Diagnosis not present

## 2023-03-23 DIAGNOSIS — I2584 Coronary atherosclerosis due to calcified coronary lesion: Secondary | ICD-10-CM | POA: Diagnosis not present

## 2023-03-23 DIAGNOSIS — R82998 Other abnormal findings in urine: Secondary | ICD-10-CM | POA: Diagnosis not present

## 2023-03-23 DIAGNOSIS — E663 Overweight: Secondary | ICD-10-CM | POA: Diagnosis not present

## 2023-03-23 DIAGNOSIS — Z0000 Encounter for general adult medical examination without abnormal findings: Secondary | ICD-10-CM | POA: Diagnosis not present

## 2023-04-28 ENCOUNTER — Encounter
Payer: BC Managed Care – PPO | Attending: Physical Medicine and Rehabilitation | Admitting: Physical Medicine and Rehabilitation

## 2023-04-28 VITALS — BP 127/80 | HR 81 | Ht 72.0 in | Wt 184.0 lb

## 2023-04-28 DIAGNOSIS — F419 Anxiety disorder, unspecified: Secondary | ICD-10-CM | POA: Insufficient documentation

## 2023-04-28 DIAGNOSIS — Z789 Other specified health status: Secondary | ICD-10-CM | POA: Diagnosis not present

## 2023-04-28 DIAGNOSIS — G894 Chronic pain syndrome: Secondary | ICD-10-CM | POA: Diagnosis not present

## 2023-04-28 NOTE — Addendum Note (Signed)
Addended by: Horton Chin on: 04/28/2023 03:03 PM   Modules accepted: Level of Service

## 2023-04-28 NOTE — Patient Instructions (Signed)
Red light therapy [facility] 

## 2023-04-28 NOTE — Progress Notes (Addendum)
Subjective:    Patient ID: Gus Puma, male    DOB: [birth year], 57 y.o.   MRN: [id number]  HPI Mrs. Staley is a 57 year old man who presents for follow-up of chronic pain and anxiety  1) Chronic pain -average pain is 6/10 -should is hurting now -does not want to to PT -he is taking Advil -pain right now is 6/10 -he takes advil.  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   2) Chronic anxiety: -his father-in-law just passed away -his daughter is getting ready to go to college and he feels this may decrease his stress -still not drinking alcohol and not drinking sugar -he is still keeping his carbs low and going to the gym. -he has been taking klonopin since 2005 -he has never changed the dosage on this medication -feels brighter -feels very happy -at first he felt agitated when he started the ketamine -he decreased klonopin to 1/2 a tablet in the morning and a full tablet in the evening.   3) Overweight -he has been eating cleaner and exercising 4 times per week -he used to weight train -he lifts aerobically and not high weight as he does not want to get injuries. -wants to get down to 185 lbs  -he dropped three pant sizes -discussed that he has been able to loose weight to 185 lbs before.   Pain Inventory Average Pain 2 Pain Right Now 3 My pain is aching  In the last 24 hours, has pain interfered with the following? General activity 5 Relation with others 1 Enjoyment of life 1 What TIME of day is your pain at its worst? night Sleep (in general) Good  Pain is worse with: sitting and standing Pain improves with: heat/ice and therapy/exercise Relief from Meds: 4       Family History  Problem Relation Age of Onset   Colon cancer Neg Hx    Colon polyps Neg Hx     Esophageal cancer Neg Hx    Rectal cancer Neg Hx    Stomach cancer Neg Hx    Social History   Socioeconomic History   Marital status: Married    Spouse name: Not on file   Number of children: Not on file   Years of education: Not on file   Highest education level: Not on file  Occupational History   Not on file  Tobacco Use   Smoking status: Never   Smokeless tobacco: Never  Substance and Sexual Activity   Alcohol use: Yes    Alcohol/week: 2.0 standard drinks of alcohol    Types: 2 Cans of beer per week   Drug use: No   Sexual activity: Not on file  Other Topics Concern   Not on file  Social History Narrative   Not on file   Social Determinants of Health   Financial Resource Strain: Not on file  Food Insecurity: Not on file  Transportation Needs: Not on file  Physical Activity: Not on file  Stress: Not on file  Social Connections: Not on file   Past Surgical History:  Procedure Laterality Date   HERNIA REPAIR  2002, 2017   x 2   KNEE SURGERY Right 1996   TONSILLECTOMY     WISDOM TOOTH EXTRACTION  Past Medical History:  Diagnosis Date   Anxiety    Arthritis    right knee, lower back L3-4-5, Left shoulder   Hyperlipidemia    Thyroid disease    BP 127/80   Pulse 81   Ht 6' (1.829 m)   Wt 184 lb (83.5 kg)   SpO2 96%   BMI 24.95 kg/m   Opioid Risk Score:   Fall Risk Score:  `1  Depression screen PHQ 2/9     01/19/2023    3:00 PM 12/16/2022    2:17 PM  Depression screen PHQ 2/9  Decreased Interest 0 0  Down, Depressed, Hopeless 0 1  PHQ - 2 Score 0 1  Altered sleeping  1  Tired, decreased energy  0  Change in appetite  3  Feeling bad or failure about yourself   0  Trouble concentrating  1  Moving slowly or fidgety/restless  0  Suicidal thoughts  0  PHQ-9 Score  6  Difficult doing work/chores  Somewhat difficult     Review of Systems  Gastrointestinal:  Positive for diarrhea.  Musculoskeletal:  Positive for arthralgias and back pain.        Left arm RT knee RT ankle B/L hand Upper Back Lumbar  All other systems reviewed and are negative.      Objective:   Physical Exam  Gen: no distress, normal appearing, BMI 28.07, weight 207 lbs, BP 129/89 Gen: no distress, normal appearing HEENT: oral mucosa pink and moist, NCAT Cardio: Reg rate Chest: normal effort, normal rate of breathing Abd: soft, non-distended Ext: no edema Psych: pleasant, normal affect Skin: intact Neuro: Alert and oriented x3      Assessment & Plan:   1) Chronic Pain Syndrome secondary to lower back pain, left shoulder pain, knee pain -discussed side effects with chronic advil use -discussed microdosing ketamine to help with both pain and anxiety -discussed red light therapy -commended his positive dietary changes and exercise routine -commended on losing 36 lbs! -recommended red light therapy -recommend icy hot -discussed that he has tried tens unit in the past  -Provided with a pain relief journal and discussed that it contains foods and lifestyle tips to naturally help to improve pain. Discussed that these lifestyle strategies are also very good for health unlike some medications which can have negative side effects. Discussed that the act of keeping a journal can be therapeutic and helpful to realize patterns what helps to trigger and alleviate pain.    -Discussed current symptoms of pain and history of pain.  -Discussed benefits of exercise in reducing pain. -Discussed following foods that may reduce pain: 1) Ginger (especially studied for arthritis)- reduce leukotriene production to decrease inflammation 2) Blueberries- high in phytonutrients that decrease inflammation 3) Salmon- marine omega-3s reduce joint swelling and pain 4) Pumpkin seeds- reduce inflammation 5) dark chocolate- reduces inflammation 6) turmeric- reduces inflammation 7) tart cherries - reduce pain and stiffness 8) extra virgin olive oil - its compound olecanthal  helps to block prostaglandins  9) chili peppers- can be eaten or applied topically via capsaicin 10) mint- helpful for headache, muscle aches, joint pain, and itching 11) garlic- reduces inflammation  Link to further information on diet for chronic pain: [email or web address]   2) Anxiety: -Discussed his goal to get of klonopin.  -discussed his social stressors -discussed that he enjoys work, that he has mended relationship with his father and brother.  -continue ketamine -discussed the benefit of habit -discussed that he loves golf -discussed neuropsych  referral and that he no longer feels he needs it -d/c clonazepam in the am -continue clonazepam 0.5mg  HS -commended on avoiding carbs, sugar dairy, discussed that this has helped his anxiety as well -discussed behavioral therapy, referred.  -discussed that he grew up in a bad place.  -discussed similar symptoms in his children -discussed that he sleeps well at night.  -Discussed exercise and meditation as tools to decrease anxiety. -Recommended Down Dog Yoga app -Discussed spending time outdoors. -Discussed positive re-framing of anxiety.  -Discussed the following foods that have been show to reduce anxiety: 1) [place] nuts, mushrooms, soy beans due to their high selenium content. Upper limit of toxicity of selenium is 45mcg/day so no more than 3-4 [place] nuts per day.  2) Fatty fish such as salmon, mackerel, sardines, trout, and herring- high in omega-3 fatty acids 3) Eggs- increases serotonin and dopamine 4) Pumpkin seeds- high in omega-3 fatty acids 5) dark chocolate- high in flavanols that increase blood flow to brain 6) turmeric- take with black pepper to increase absorption 7) chamomile tea- antioxidant and anti-inflammatory properties 8) yogurt without sugar- supports gut-brain axis 9) green tea- contains L- theanine 10) blueberries- high in vitamin C and  antioxidants 11) [place]- high in tryptophan which gets converted to serotonin 12) bell peppers- rich in vitamin C and antioxidants 13) citrus fruits- rich in vitamin C and antioxidants 14) almonds- high in vitamin E and healthy fats 15) chia seeds- high in omega-3 fatty acids  3) Overweight, discussed his his weight has normalized  -discussed that belly fat is a better measure than BMI Discussed that he decreased from a 39 to 34 waist -discussed his clean eating, his exercise routine.  -discussed fasting  -discussed continuous blood glucose monitoring  -discussed his weight loss -discussed that he has eliminated dairy, sweets, white bread, alcohol  4) Abstinence of alcohol -commended on his ability to stop drinking alcohol!  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

## 2023-07-31 ENCOUNTER — Encounter
Payer: BC Managed Care – PPO | Attending: Physical Medicine and Rehabilitation | Admitting: Physical Medicine and Rehabilitation

## 2023-07-31 ENCOUNTER — Encounter: Payer: Self-pay | Admitting: Physical Medicine and Rehabilitation

## 2023-07-31 VITALS — BP 131/83 | HR 74 | Ht 72.0 in | Wt 198.0 lb

## 2023-07-31 DIAGNOSIS — Z79891 Long term (current) use of opiate analgesic: Secondary | ICD-10-CM | POA: Diagnosis not present

## 2023-07-31 DIAGNOSIS — G894 Chronic pain syndrome: Secondary | ICD-10-CM | POA: Diagnosis not present

## 2023-07-31 DIAGNOSIS — Z5181 Encounter for therapeutic drug level monitoring: Secondary | ICD-10-CM | POA: Insufficient documentation

## 2023-07-31 NOTE — Progress Notes (Signed)
Subjective:    Patient ID: Wesley Key, male    DOB: 1965/11/28, 57 y.o.   MRN: [id number]  HPI Wesley Key is a 57 year old man who presents for follow-up of chronic pain and anxiety  1) Chronic pain -average pain is 3/10 -pain has bee well controlled -still stiff -knee and back still hurty -should is hurting now -does not want to to PT -he is taking Advil -pain right now is 6/10 -he takes advil.  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   2) Chronic anxiety: -his father-in-law just passed away -his daughter is getting ready to go to college and he feels this may decrease his stress -still not drinking alcohol and not drinking sugar -he is still keeping his carbs low and going to the gym. -he has been taking klonopin since 2005 -he has never changed the dosage on this medication -feels brighter -feels very happy -at first he felt agitated when he started the ketamine -he decreased klonopin to 1/2 a tablet in the morning and a full tablet in the evening.   3) Overweight -he has been eating cleaner and exercising 4 times per week -weight is 190.8 -he used to weight train -he lifts aerobically and not high weight as he does not want to get injuries. -wants to get down to 185 lbs  -he dropped three pant sizes -discussed that he has been able to loose weight to 185 lbs before.  -he is little as he has been in some time -he is building muscle  Pain Inventory Average Pain 3 Pain Right Now 3 My pain is sharp  In the last 24 hours, has pain interfered with the following? General activity 2 Relation with others 2 Enjoyment of life 2 What TIME of day is your pain at its worst? morning  and night Sleep (in general) Good  Pain is worse with: sitting and standing Pain improves  with: heat/ice Relief from Meds:  .       Family History  Problem Relation Age of Onset   Colon cancer Neg Hx    Colon polyps Neg Hx    Esophageal cancer Neg Hx    Rectal cancer Neg Hx    Stomach cancer Neg Hx    Social History   Socioeconomic History   Marital status: Married    Spouse name: Not on file   Number of children: Not on file   Years of education: Not on file   Highest education level: Not on file  Occupational History   Not on file  Tobacco Use   Smoking status: Never   Smokeless tobacco: Never  Substance and Sexual Activity   Alcohol use: Yes    Alcohol/week: 2.0 standard drinks of alcohol    Types: 2 Cans of beer per week   Drug use: No   Sexual activity: Not on file  Other Topics Concern   Not on file  Social History Narrative   Not on file   Social Determinants of Health   Financial Resource Strain: Not on file  Food Insecurity: Not on file  Transportation Needs: Not on file  Physical Activity: Not on file  Stress: Not on file  Social Connections: Not on file   Past Surgical History:  Procedure Laterality Date  HERNIA REPAIR  2002, 2017   x 2   KNEE SURGERY Right 1996   TONSILLECTOMY     WISDOM TOOTH EXTRACTION     Past Medical History:  Diagnosis Date   Anxiety    Arthritis    right knee, lower back L3-4-5, Left shoulder   Hyperlipidemia    Thyroid disease    There were no vitals taken for this visit.  Opioid Risk Score:   Fall Risk Score:  `1  Depression screen PHQ 2/9     01/19/2023    3:00 PM 12/16/2022    2:17 PM  Depression screen PHQ 2/9  Decreased Interest 0 0  Down, Depressed, Hopeless 0 1  PHQ - 2 Score 0 1  Altered sleeping  1  Tired, decreased energy  0  Change in appetite  3  Feeling bad or failure about yourself   0  Trouble concentrating  1  Moving slowly or fidgety/restless  0  Suicidal thoughts  0  PHQ-9 Score  6  Difficult doing work/chores  Somewhat difficult     Review of Systems   Gastrointestinal:  Positive for diarrhea.  Musculoskeletal:  Positive for arthralgias and back pain.       Left arm RT knee RT ankle B/L hand Upper Back Lumbar  All other systems reviewed and are negative.      Objective:   Physical Exam  Gen: no distress, normal appearing,  Gen: no distress, normal appearing HEENT: oral mucosa pink and moist, NCAT Cardio: Reg rate Chest: normal effort, normal rate of breathing Abd: soft, non-distended Ext: no edema Psych: pleasant, normal affect Skin: intact Neuro: Alert and oriented x3      Assessment & Plan:   1) Chronic Pain Syndrome secondary to lower back pain, left shoulder pain, knee pain -discussed side effects with chronic advil use -discussed microdosing ketamine to help with both pain and anxiety -discussed red light therapy -commended his positive dietary changes and exercise routine -commended on losing 36 lbs! -recommended red light therapy -recommend icy hot -discussed that he has tried tens unit in the past -discussed improvements in pain -discussed that he has been hitting golf balls again -continue ketamine. Pain contract and urine sample performed.  -discussed that he has been in the   -Provided with a pain relief journal and discussed that it contains foods and lifestyle tips to naturally help to improve pain. Discussed that these lifestyle strategies are also very good for health unlike some medications which can have negative side effects. Discussed that the act of keeping a journal can be therapeutic and helpful to realize patterns what helps to trigger and alleviate pain.    -Discussed current symptoms of pain and history of pain.  -Discussed benefits of exercise in reducing pain. -Discussed following foods that may reduce pain: 1) Ginger (especially studied for arthritis)- reduce leukotriene production to decrease inflammation 2) Blueberries- high in phytonutrients that decrease inflammation 3) Salmon-  marine omega-3s reduce joint swelling and pain 4) Pumpkin seeds- reduce inflammation 5) dark chocolate- reduces inflammation 6) turmeric- reduces inflammation 7) tart cherries - reduce pain and stiffness 8) extra virgin olive oil - its compound olecanthal helps to block prostaglandins  9) chili peppers- can be eaten or applied topically via capsaicin 10) mint- helpful for headache, muscle aches, joint pain, and itching 11) garlic- reduces inflammation  Link to further information on diet for chronic pain: [email or web address]   2) Anxiety: -discussed that he is weaning of the klonopin -Discussed his  goal to get of klonopin.  -discussed his social stressors -discussed that he enjoys work, that he has mended relationship with his father and brother.  -continue ketamine -discussed the benefit of habit -discussed that he loves golf -discussed neuropsych referral and that he no longer feels he needs it -d/c clonazepam in the am -continue clonazepam 0.5mg  HS -commended on avoiding carbs, sugar dairy, discussed that this has helped his anxiety as well -discussed behavioral therapy, referred.  -discussed that he grew up in a bad place.  -discussed similar symptoms in his children -discussed that he sleeps well at night.  -Discussed exercise and meditation as tools to decrease anxiety. -Recommended Down Dog Yoga app -Discussed spending time outdoors. -Discussed positive re-framing of anxiety.  -Discussed the following foods that have been show to reduce anxiety: 1) [place] nuts, mushrooms, soy beans due to their high selenium content. Upper limit of toxicity of selenium is 453mcg/day so no more than 3-4 [place] nuts per day.  2) Fatty fish such as salmon, mackerel, sardines, trout, and herring- high in omega-3 fatty acids 3) Eggs- increases serotonin and dopamine 4) Pumpkin seeds- high in omega-3 fatty acids 5) dark  chocolate- high in flavanols that increase blood flow to brain 6) turmeric- take with black pepper to increase absorption 7) chamomile tea- antioxidant and anti-inflammatory properties 8) yogurt without sugar- supports gut-brain axis 9) green tea- contains L- theanine 10) blueberries- high in vitamin C and antioxidants 11) [place]- high in tryptophan which gets converted to serotonin 12) bell peppers- rich in vitamin C and antioxidants 13) citrus fruits- rich in vitamin C and antioxidants 14) almonds- high in vitamin E and healthy fats 15) chia seeds- high in omega-3 fatty acids  3) Overweight, discussed his his weight has normalized  -discussed that belly fat is a better measure than BMI Discussed that he decreased from a 39 to 34 waist -discussed his clean eating, his exercise routine.  -discussed fasting  -discussed continuous blood glucose monitoring  -discussed his weight loss -discussed that he has eliminated dairy, sweets, white bread, alcohol -commended on weight loss  4) Abstinence of alcohol -commended on his ability to stop drinking alcohol!  5) HLD: -discussed that he has never had a heart attack but is on simvastatin -discussed that his scan of his arteries was stable -discussed that his LDL was elevated to 270 when he was younger  6) Decreased cartilage in knee: -discussed collagen supplement and he defers  >40 minutes spent in discussion of ketamine, its benefits for pain, knots in back, discussed that low back pain is improved, trigger point injections for his tight lats and cervical myofascial pain, discussed that he wants to minimize doctor's appoitnemnts, discussed that he does not want opioids, discussed that he contnues to fo to the gym, discussed pain contract and urine sample today, discussed weight loss

## 2023-08-07 LAB — TOXASSURE SELECT,+ANTIDEPR,UR

## 2023-08-20 DIAGNOSIS — L570 Actinic keratosis: Secondary | ICD-10-CM | POA: Diagnosis not present

## 2023-08-20 DIAGNOSIS — L814 Other melanin hyperpigmentation: Secondary | ICD-10-CM | POA: Diagnosis not present

## 2023-08-24 ENCOUNTER — Encounter
Payer: BC Managed Care – PPO | Attending: Physical Medicine and Rehabilitation | Admitting: Physical Medicine and Rehabilitation

## 2023-08-24 ENCOUNTER — Encounter: Payer: Self-pay | Admitting: Physical Medicine and Rehabilitation

## 2023-08-24 VITALS — BP 131/87 | HR 70 | Ht 72.0 in | Wt 192.0 lb

## 2023-08-24 DIAGNOSIS — M7918 Myalgia, other site: Secondary | ICD-10-CM | POA: Diagnosis not present

## 2023-08-24 MED ORDER — LIDOCAINE HCL 1 % IJ SOLN
3.0000 mL | Freq: Once | INTRAMUSCULAR | Status: AC
  Administered 2023-08-25: 3 mL via INTRADERMAL

## 2023-08-24 NOTE — Addendum Note (Signed)
Addended by: Horton Chin on: 08/24/2023 08:22 PM   Modules accepted: Orders

## 2023-08-24 NOTE — Progress Notes (Signed)
Trigger Point Injection  Indication: Myofascial pain not relieved by medication management and other conservative care.  Informed consent was obtained after describing risk and benefits of the procedure with the patient, this includes bleeding, bruising, infection and medication side effects.  The patient wishes to proceed and has given written consent.  The patient was placed in a seated position.  The area of pain was marked and prepped with Betadine.  It was entered with a 25-gauge 1/2 inch needle and a total of 5 mL of 1% lidocaine and normal saline was injected into a total of 4 trigger points, after negative draw back for blood.  The patient tolerated the procedure well.  Post procedure instructions were given.

## 2023-08-24 NOTE — Addendum Note (Signed)
Addended by: Becky Sax on: 08/24/2023 03:59 PM   Modules accepted: Orders

## 2023-08-25 DIAGNOSIS — M7918 Myalgia, other site: Secondary | ICD-10-CM | POA: Diagnosis not present

## 2023-09-08 ENCOUNTER — Telehealth: Payer: Self-pay

## 2023-09-08 NOTE — Telephone Encounter (Signed)
Wesley Key would like to know why his Rx Ketamine was not filled?   Call back phone [phone].

## 2023-09-14 DIAGNOSIS — H5213 Myopia, bilateral: Secondary | ICD-10-CM | POA: Diagnosis not present

## 2023-09-14 DIAGNOSIS — D3131 Benign neoplasm of right choroid: Secondary | ICD-10-CM | POA: Diagnosis not present

## 2024-02-18 ENCOUNTER — Encounter: Payer: BC Managed Care – PPO | Admitting: Physical Medicine and Rehabilitation

## 2024-03-02 DIAGNOSIS — L570 Actinic keratosis: Secondary | ICD-10-CM | POA: Diagnosis not present

## 2024-03-02 DIAGNOSIS — L821 Other seborrheic keratosis: Secondary | ICD-10-CM | POA: Diagnosis not present

## 2024-03-02 DIAGNOSIS — D2362 Other benign neoplasm of skin of left upper limb, including shoulder: Secondary | ICD-10-CM | POA: Diagnosis not present

## 2024-03-02 DIAGNOSIS — D225 Melanocytic nevi of trunk: Secondary | ICD-10-CM | POA: Diagnosis not present

## 2024-03-02 DIAGNOSIS — L578 Other skin changes due to chronic exposure to nonionizing radiation: Secondary | ICD-10-CM | POA: Diagnosis not present

## 2024-03-09 ENCOUNTER — Telehealth: Payer: Self-pay | Admitting: *Deleted

## 2024-03-09 ENCOUNTER — Other Ambulatory Visit: Payer: Self-pay | Admitting: *Deleted

## 2024-03-14 NOTE — Telephone Encounter (Signed)
 [facility]. Patient states he is getting.

## 2024-03-14 NOTE — Telephone Encounter (Signed)
 Please call [facility] [phone] verbal refill of Ketamine? Or denial.

## 2024-03-18 DIAGNOSIS — E039 Hypothyroidism, unspecified: Secondary | ICD-10-CM | POA: Diagnosis not present

## 2024-03-18 DIAGNOSIS — Z125 Encounter for screening for malignant neoplasm of prostate: Secondary | ICD-10-CM | POA: Diagnosis not present

## 2024-03-25 DIAGNOSIS — E039 Hypothyroidism, unspecified: Secondary | ICD-10-CM | POA: Diagnosis not present

## 2024-03-25 DIAGNOSIS — Z1331 Encounter for screening for depression: Secondary | ICD-10-CM | POA: Diagnosis not present

## 2024-03-25 DIAGNOSIS — R82998 Other abnormal findings in urine: Secondary | ICD-10-CM | POA: Diagnosis not present

## 2024-03-25 DIAGNOSIS — Z0000 Encounter for general adult medical examination without abnormal findings: Secondary | ICD-10-CM | POA: Diagnosis not present

## 2024-04-12 ENCOUNTER — Ambulatory Visit: Admitting: Physical Medicine and Rehabilitation

## 2024-04-25 ENCOUNTER — Encounter: Attending: Physical Medicine and Rehabilitation | Admitting: Physical Medicine and Rehabilitation

## 2024-04-25 VITALS — BP 148/90 | HR 66 | Ht 72.0 in | Wt 186.0 lb

## 2024-04-25 DIAGNOSIS — G8929 Other chronic pain: Secondary | ICD-10-CM | POA: Diagnosis not present

## 2024-04-25 DIAGNOSIS — M25562 Pain in left knee: Secondary | ICD-10-CM | POA: Diagnosis not present

## 2024-04-25 DIAGNOSIS — M25519 Pain in unspecified shoulder: Secondary | ICD-10-CM | POA: Diagnosis not present

## 2024-04-25 DIAGNOSIS — F419 Anxiety disorder, unspecified: Secondary | ICD-10-CM | POA: Insufficient documentation

## 2024-04-25 DIAGNOSIS — E663 Overweight: Secondary | ICD-10-CM | POA: Insufficient documentation

## 2024-04-25 DIAGNOSIS — I10 Essential (primary) hypertension: Secondary | ICD-10-CM | POA: Diagnosis not present

## 2024-04-25 DIAGNOSIS — M25561 Pain in right knee: Secondary | ICD-10-CM | POA: Diagnosis not present

## 2024-04-25 NOTE — Progress Notes (Addendum)
 Subjective:    Patient ID: Norman FORBES Coast, male    DOB: 11/20/1965, 58 y.o.   MRN: [id number]  HPI Mrs. Kates is a 58 year old man who presents for follow-up of chronic pain and anxiety  1) Chronic pain, shoulder and and knee pain: -much improved! Was able to play tennis today without pain -average pain is 3/10 -pain has bee well controlled -still stiff -knee and back still hurty -should is hurting now -does not want to to PT -he is taking Advil -pain right now is 6/10 -he takes advil.  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   2) Chronic anxiety: -exercise helps his anxiety -staying away from sugar and dairy helps -has not had a drink in 1 year and 6 months! -his father-in-law just passed away -his daughter is getting ready to go to college and he feels this may decrease his stress -still not drinking alcohol and not drinking sugar -he is still keeping his carbs low and going to the gym. -he has been taking klonopin since 2005 -he has never changed the dosage on this medication -feels brighter -feels very happy -at first he felt agitated when he started the ketamine -he decreased klonopin to 1/2 a tablet in the morning and a full tablet in the evening.   3) Overweight -he has been eating cleaner and exercising 4 times per week -weight is 190.8 -he used to weight train -he lifts aerobically and not high weight as he does not want to get injuries. -wants to get down to 185 lbs  -he dropped three pant sizes -discussed that he has been able to loose weight to 185 lbs before.  -he is little as he has been in some time -he is building muscle  Pain Inventory Average Pain 2 Pain Right Now 2 My pain is sharp  In the last 24 hours, has pain interfered with the following? General  activity 0 Relation with others 0 Enjoyment of life 0 What TIME of day is your pain at its worst? morning  Sleep (in general) Good  Pain is worse with: inactivity Pain improves with: heat/ice Relief from Meds: .       Family History  Problem Relation Age of Onset   Colon cancer Neg Hx    Colon polyps Neg Hx    Esophageal cancer Neg Hx    Rectal cancer Neg Hx    Stomach cancer Neg Hx    Social History   Socioeconomic History   Marital status: Married    Spouse name: Not on file   Number of children: Not on file   Years of education: Not on file   Highest education level: Not on file  Occupational History   Not on file  Tobacco Use   Smoking status: Never   Smokeless tobacco: Never  Vaping Use   Vaping status: Never Used  Substance and Sexual Activity   Alcohol use: Yes    Alcohol/week: 2.0 standard drinks of alcohol    Types: 2 Cans of beer per week   Drug use: No   Sexual activity: Not on file  Other Topics Concern   Not on file  Social History Narrative   Not on file   Social Drivers of Health   Financial Resource Strain:  Not on file  Food Insecurity: Not on file  Transportation Needs: Not on file  Physical Activity: Not on file  Stress: Not on file  Social Connections: Not on file   Past Surgical History:  Procedure Laterality Date   HERNIA REPAIR  2002, 2017   x 2   KNEE SURGERY Right 1996   TONSILLECTOMY     WISDOM TOOTH EXTRACTION     Past Medical History:  Diagnosis Date   Anxiety    Arthritis    right knee, lower back L3-4-5, Left shoulder   Hyperlipidemia    Thyroid disease    BP (!) 148/90   Pulse 66   Ht 6' (1.829 m)   Wt 186 lb (84.4 kg)   SpO2 96%   BMI 25.23 kg/m   Opioid Risk Score:   Fall Risk Score:  `1  Depression screen PHQ 2/9     08/24/2023   10:00 AM 07/31/2023    9:27 AM 01/19/2023    3:00 PM 12/16/2022    2:17 PM  Depression screen PHQ 2/9  Decreased Interest 0 0 0 0  Down, Depressed, Hopeless 0 0 0 1   PHQ - 2 Score 0 0 0 1  Altered sleeping    1  Tired, decreased energy    0  Change in appetite    3  Feeling bad or failure about yourself     0  Trouble concentrating    1  Moving slowly or fidgety/restless    0  Suicidal thoughts    0  PHQ-9 Score    6  Difficult doing work/chores    Somewhat difficult     Review of Systems  Gastrointestinal:  Positive for diarrhea.  Musculoskeletal:  Positive for arthralgias and back pain.       Left arm RT knee RT ankle B/L hand Upper Back Lumbar  All other systems reviewed and are negative.      Objective:   Physical Exam  Gen: no distress, normal appearing, BMI 25.23, BP 148/90 HEENT: oral mucosa pink and moist, NCAT Cardio: Reg rate Chest: normal effort, normal rate of breathing Abd: soft, non-distended Ext: no edema Psych: pleasant, normal affect Skin: intact Neuro: Alert and oriented x3, stable      Assessment & Plan:   1) Chronic Pain Syndrome secondary to lower back pain, left shoulder pain, knee pain -continue ketamine -discussed that strengthening muscles helps to put less force on joints -discussed that pain is much improved -discussed side effects with chronic advil use -discussed microdosing ketamine to help with both pain and anxiety -discussed red light therapy -commended his positive dietary changes and exercise routine -commended on losing 36 lbs! -recommended red light therapy -recommend icy hot -discussed that he has tried tens unit in the past -discussed improvements in pain -discussed that he has been hitting golf balls again -continue ketamine. Pain contract and urine sample performed.  -discussed that he has been in the   -Provided with a pain relief journal and discussed that it contains foods and lifestyle tips to naturally help to improve pain. Discussed that these lifestyle strategies are also very good for health unlike some medications which can have negative side effects. Discussed that the  act of keeping a journal can be therapeutic and helpful to realize patterns what helps to trigger and alleviate pain.    -Discussed current symptoms of pain and history of pain.  -Discussed benefits of exercise in reducing pain. -Discussed following foods that may  reduce pain: 1) Ginger (especially studied for arthritis)- reduce leukotriene production to decrease inflammation 2) Blueberries- high in phytonutrients that decrease inflammation 3) Salmon- marine omega-3s reduce joint swelling and pain 4) Pumpkin seeds- reduce inflammation 5) dark chocolate- reduces inflammation 6) turmeric- reduces inflammation 7) tart cherries - reduce pain and stiffness 8) extra virgin olive oil - its compound olecanthal helps to block prostaglandins  9) chili peppers- can be eaten or applied topically via capsaicin 10) mint- helpful for headache, muscle aches, joint pain, and itching 11) garlic- reduces inflammation  Link to further information on diet for chronic pain: [email or web address]   2) Anxiety: -continue ketamine -discussed that he is having stress at work and is stressed regarding his daughter going to college -discussed that he is weaning of the klonopin -Discussed his goal to get of klonopin.  -discussed his social stressors -discussed that he enjoys work, that he has mended relationship with his father and brother.  -continue ketamine -discussed the benefit of habit -discussed that he loves golf -discussed neuropsych referral and that he no longer feels he needs it -d/c clonazepam in the am -continue clonazepam 0.5mg  HS -commended on avoiding carbs, sugar dairy, discussed that this has helped his anxiety as well -discussed behavioral therapy, referred.  -discussed that he grew up in a bad place.  -discussed similar symptoms in his children -discussed that he sleeps well at night.  -Discussed exercise and  meditation as tools to decrease anxiety. -Recommended Down Dog Yoga app -Discussed spending time outdoors. -Discussed positive re-framing of anxiety.  -Discussed the following foods that have been show to reduce anxiety: 1) [place] nuts, mushrooms, soy beans due to their high selenium content. Upper limit of toxicity of selenium is 495mcg/day so no more than 3-4 [place] nuts per day.  2) Fatty fish such as salmon, mackerel, sardines, trout, and herring- high in omega-3 fatty acids 3) Eggs- increases serotonin and dopamine 4) Pumpkin seeds- high in omega-3 fatty acids 5) dark chocolate- high in flavanols that increase blood flow to brain 6) turmeric- take with black pepper to increase absorption 7) chamomile tea- antioxidant and anti-inflammatory properties 8) yogurt without sugar- supports gut-brain axis 9) green tea- contains L- theanine 10) blueberries- high in vitamin C and antioxidants 11) [place]- high in tryptophan which gets converted to serotonin 12) bell peppers- rich in vitamin C and antioxidants 13) citrus fruits- rich in vitamin C and antioxidants 14) almonds- high in vitamin E and healthy fats 15) chia seeds- high in omega-3 fatty acids  3) Overweight, discussed his his weight has normalized  -discussed that belly fat is a better measure than BMI Discussed that he decreased from a 39 to 34 waist -discussed that current weight is down to 180 in the mornings -discussed his clean eating, his exercise routine.  -discussed fasting  -discussed continuous blood glucose monitoring  -discussed his weight loss -discussed that he has eliminated dairy, sweets, white bread, alcohol -commended on weight loss  4) Abstinence of alcohol -commended on his ability to stop drinking alcohol!  5) HLD: -discussed that he has never had a heart attack but is on simvastatin -discussed that his scan of his arteries was stable -discussed that his LDL was elevated to 270 when he was younger  6)  Decreased cartilage in knee: -discussed collagen supplement and he defers  7) HTN:  -recommended checking BP at home -discussed that BP was normal at his last appointment with the PCP

## 2024-11-14 ENCOUNTER — Ambulatory Visit: Admitting: Physical Medicine and Rehabilitation

## 2024-12-06 ENCOUNTER — Telehealth: Payer: Self-pay | Admitting: *Deleted

## 2024-12-06 NOTE — Telephone Encounter (Signed)
 [facility] requesting refill on Ketamine .

## 2024-12-08 ENCOUNTER — Other Ambulatory Visit: Payer: Self-pay

## 2024-12-08 MED ORDER — KETAMINE HCL POWD: 30.0000 mg | Freq: Every day | 0 refills | Status: AC

## 2025-01-03 ENCOUNTER — Encounter: Admitting: Physical Medicine and Rehabilitation
